# Patient Record
Sex: Female | Born: 2000 | Race: Black or African American | Hispanic: No | Marital: Single | Smoking: Never smoker
Health system: Southern US, Community
[De-identification: ages and names within clinical notes are randomized; demographics above are authoritative.]

## PROBLEM LIST (undated history)

## (undated) DIAGNOSIS — J45909 Unspecified asthma, uncomplicated: Secondary | ICD-10-CM

---
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
## 2019-02-15 IMAGING — DX DG ELBOW COMPLETE 3+V*L*
4 series · 4 of 4 positions shown · non-contrast
Comparison: None.

CLINICAL DATA: Patient fell hitting left elbow. Pain over the
olecranon.

EXAM:
LEFT ELBOW - COMPLETE 3+ VIEW

[elbow ap (1 of 2)]
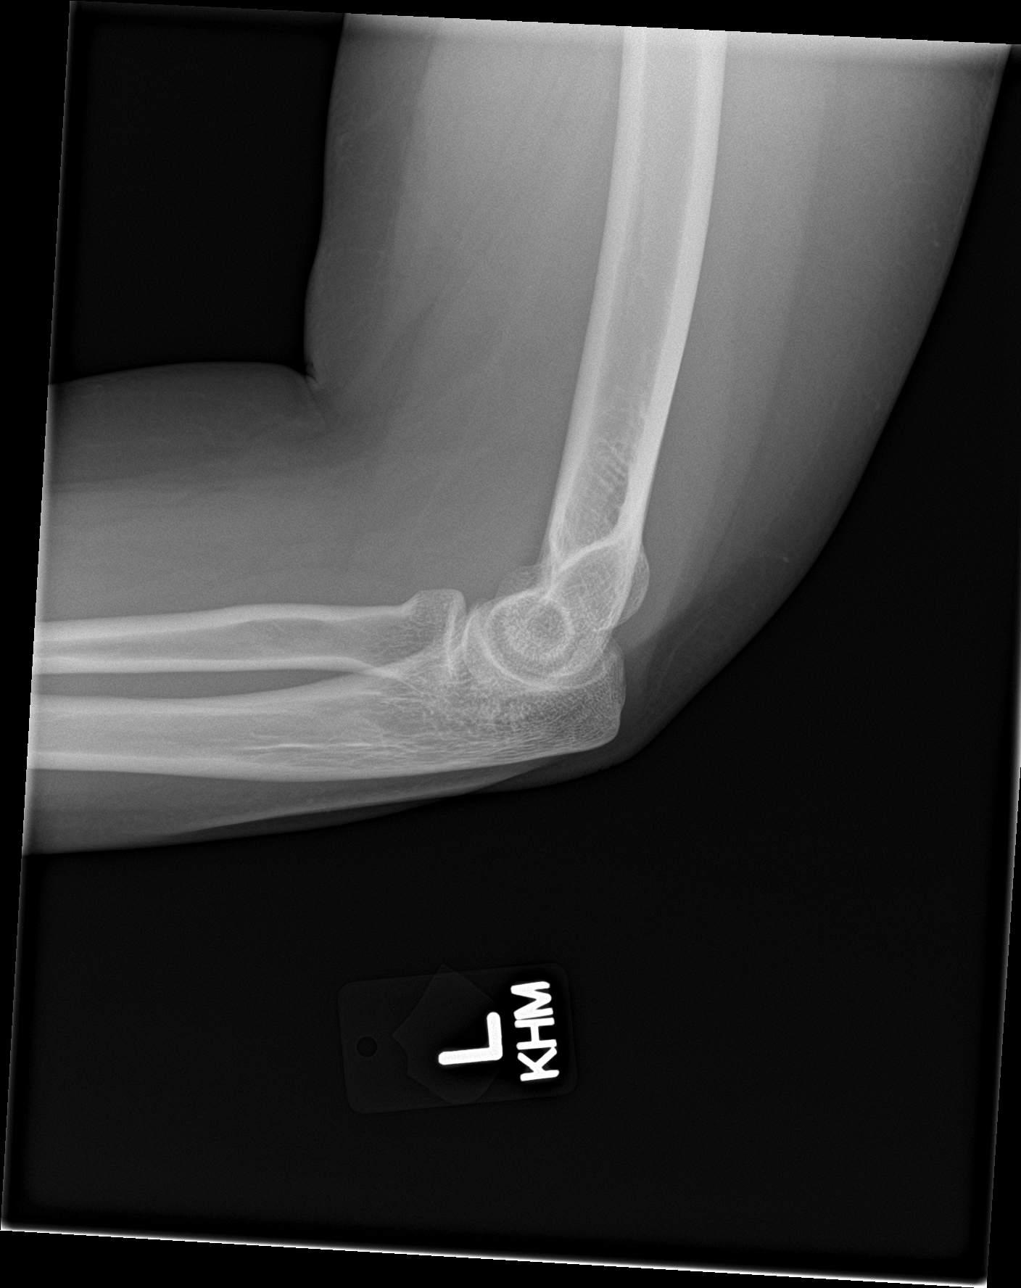

[elbow obl]
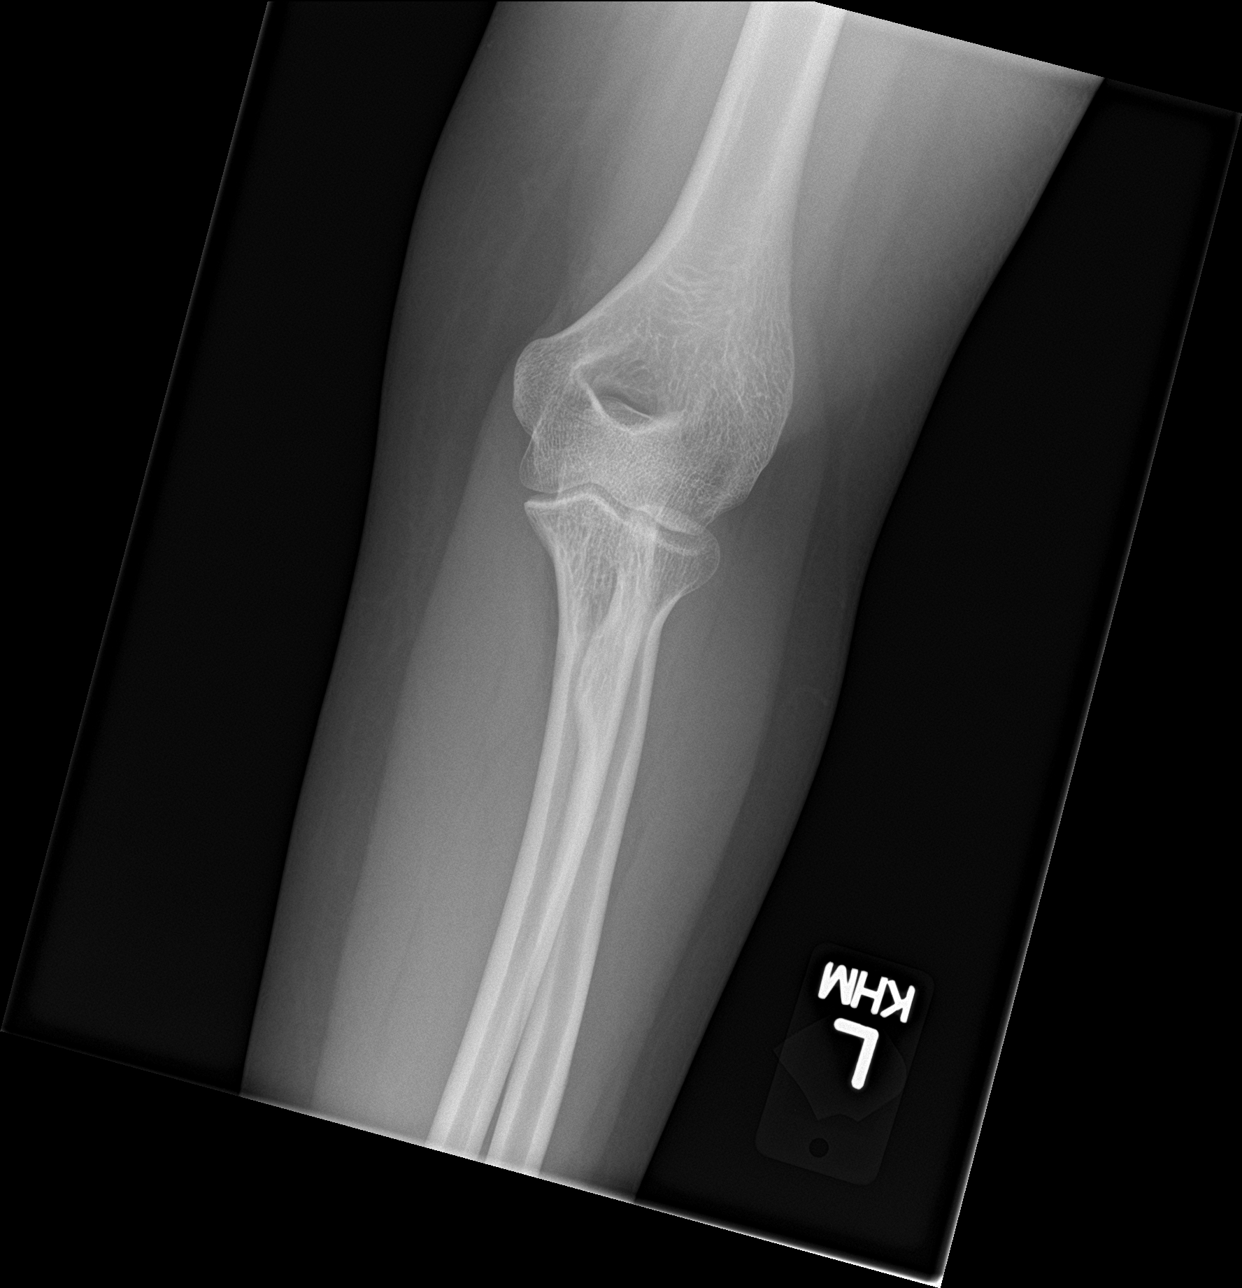

[elbow lat]
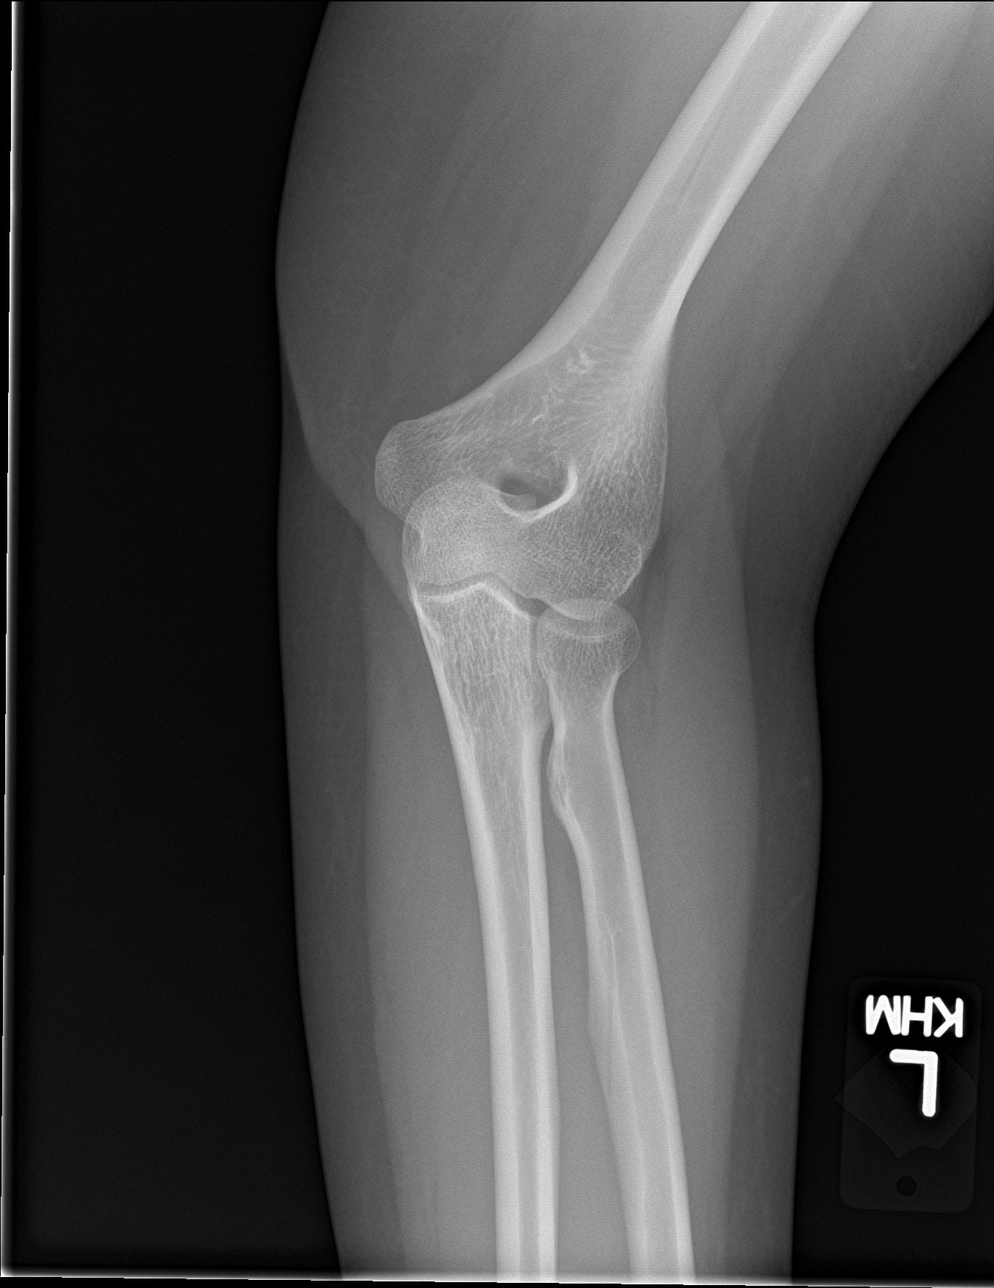

[elbow ap (2 of 2)]
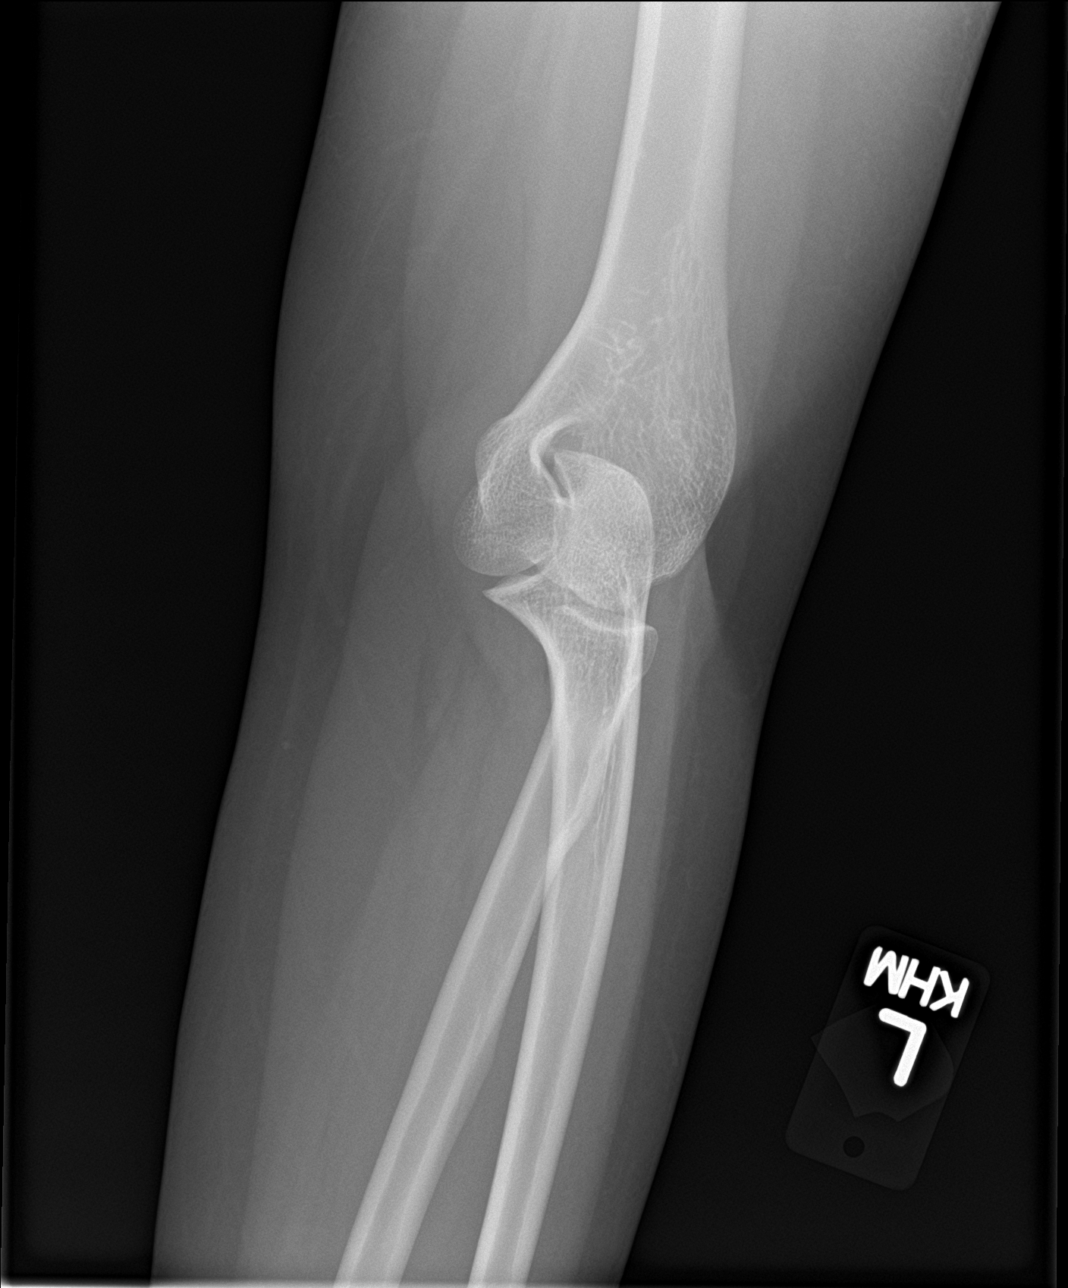

[4 of 4 positions shown; findings below may reference images not displayed]

FINDINGS: There is no evidence of fracture, dislocation, or joint effusion.
There is no evidence of arthropathy or other focal bone abnormality.
Soft tissues are unremarkable.
IMPRESSION: Negative.
